# Patient Record
Sex: Female | Born: 1960 | Race: White | Hispanic: No | Marital: Single | Smoking: Never smoker
Health system: Southern US, Community
[De-identification: ages and names within clinical notes are randomized; demographics above are authoritative.]

## PROBLEM LIST (undated history)

## (undated) DIAGNOSIS — J45909 Unspecified asthma, uncomplicated: Secondary | ICD-10-CM

## (undated) HISTORY — PX: ROTATOR CUFF REPAIR: SHX139

## (undated) HISTORY — PX: CARPAL TUNNEL RELEASE: SHX101

## (undated) HISTORY — PX: MANDIBLE RECONSTRUCTION: SHX431

## (undated) HISTORY — PX: KNEE ARTHROSCOPY: SUR90

## (undated) HISTORY — PX: FOOT ARTHROPLASTY: SHX1657

---

## 2004-04-17 ENCOUNTER — Ambulatory Visit: Payer: Self-pay | Admitting: Internal Medicine

## 2004-04-20 ENCOUNTER — Ambulatory Visit: Payer: Self-pay | Admitting: Internal Medicine

## 2004-12-26 ENCOUNTER — Ambulatory Visit: Payer: Self-pay | Admitting: Internal Medicine

## 2005-04-24 ENCOUNTER — Ambulatory Visit: Payer: Self-pay | Admitting: Internal Medicine

## 2006-01-23 ENCOUNTER — Ambulatory Visit: Payer: Self-pay | Admitting: Internal Medicine

## 2007-03-20 ENCOUNTER — Ambulatory Visit: Payer: Self-pay | Admitting: Internal Medicine

## 2007-03-23 ENCOUNTER — Ambulatory Visit: Payer: Self-pay

## 2007-04-24 ENCOUNTER — Ambulatory Visit: Payer: Self-pay | Admitting: General Practice

## 2007-05-08 ENCOUNTER — Ambulatory Visit: Payer: Self-pay | Admitting: General Practice

## 2008-09-16 ENCOUNTER — Ambulatory Visit: Payer: Self-pay | Admitting: Internal Medicine

## 2008-09-26 ENCOUNTER — Ambulatory Visit: Payer: Self-pay | Admitting: Obstetrics and Gynecology

## 2008-09-29 ENCOUNTER — Ambulatory Visit: Payer: Self-pay | Admitting: Obstetrics and Gynecology

## 2008-10-06 ENCOUNTER — Ambulatory Visit: Payer: Self-pay | Admitting: Obstetrics and Gynecology

## 2008-12-02 ENCOUNTER — Ambulatory Visit: Payer: Self-pay | Admitting: Obstetrics and Gynecology

## 2008-12-08 ENCOUNTER — Ambulatory Visit: Payer: Self-pay | Admitting: Obstetrics and Gynecology

## 2009-04-22 ENCOUNTER — Ambulatory Visit: Payer: Self-pay | Admitting: Internal Medicine

## 2009-11-23 ENCOUNTER — Ambulatory Visit: Payer: Self-pay | Admitting: Oral Surgery

## 2011-08-30 ENCOUNTER — Ambulatory Visit: Payer: Self-pay | Admitting: Family Medicine

## 2016-10-24 ENCOUNTER — Other Ambulatory Visit: Payer: Self-pay | Admitting: Family Medicine

## 2016-10-24 DIAGNOSIS — Z1231 Encounter for screening mammogram for malignant neoplasm of breast: Secondary | ICD-10-CM

## 2016-11-12 ENCOUNTER — Ambulatory Visit
Admission: RE | Admit: 2016-11-12 | Discharge: 2016-11-12 | Disposition: A | Discharge status: Home / Self Care | Payer: BLUE CROSS/BLUE SHIELD | Source: Ambulatory Visit | Attending: Family Medicine | Admitting: Family Medicine

## 2016-11-12 ENCOUNTER — Other Ambulatory Visit: Payer: Self-pay | Admitting: Family Medicine

## 2016-11-12 DIAGNOSIS — Z1231 Encounter for screening mammogram for malignant neoplasm of breast: Secondary | ICD-10-CM

## 2019-02-18 NOTE — H&P (Addendum)
PRE-OP VISIT, ENDOMETRIAL BIOPSY:   Kerry Wilson is a 58 y.o. female presenting with Pre Op Consulting (sign consents, endo bx)  HPI: Patient presents today for a pre-op visit for a Total Laparoscopic Hysterectomy with Bilateral Salpingo Oophorectomy. Surgery is indicated by her longstanding history of pelvic pain, uterine fibroids, and menorrhagia. Pt also had a few episodes of postmenopausal bleeding this year. She is s/p Novasure ablation and s/p BTL.  Body mass index is 38.21 kg/m.  Workup: [place] & E2 in 12/2018: postmenopausal Pap: EMBx: collected today TVUS 01/2019:  Uterus=8.21 x 4.24 X 5.32cm Uterus anteverted Fibroids seen:1)anterior=1cm 2)posterior=3.2cm 3)Rt lateral=1.2cm Endometrium=6.83mm Cystic area seen Rt & anterior to endometrium=0.60 x 0.37 x 0.69cm No free fluid seen Lt simple ovarian cyst=1.1cm Rt ovary appears wnl  07/2015 saline sonogram: 3 fibroids, 2 simples right ovarian cysts; EMBx neg -Tx with Nexplanon placed 2015-2017 due to bleeding not resolved, failed OCPs -Novasure ablation in 10/2015  Pertinent Surgical Hx: -BTL -Endometrial ablation  Past Medical History:  has a past medical history of Hypothyroidism, Intermittent asthma, Iron deficiency anemia, and Obesity.  Past Surgical History:  has a past surgical history that includes Shoulder surgery ; Jaw surgery ; foot surgery (Left); wrist surgery (Left); uterine ablation (01/2016); Knee arthroscopy (Right); and Endometrial ablation. Family History: family history includes Diabetes type II in her mother; High blood pressure (Hypertension) in her mother; Myocardial Infarction (Heart attack) in her mother; Osteoporosis (Thinning of bones) in her mother; Stroke in her mother. Social History:  reports that she has never smoked. She has never used smokeless tobacco. She reports current alcohol use. She reports that she does not use drugs. OB/GYN History:  OB History    Gravida  0   Para      Term       Preterm      AB  0   Living        SAB  0   TAB      Ectopic      Molar      Multiple      Live Births           Allergies: is allergic to erythromycin. Medications:  Current Outpatient Medications:  .  ferrous gluconate (FERGON) 325 MG tablet, Take 325 mg by mouth daily with breakfast., Disp: , Rfl:  .  guaifen/phenyleph/acetaminophn (MUCINEX COLD AND SINUS ORAL), Take by mouth., Disp: , Rfl:   Review of Systems: No SOB, no palpitations or chest pain, no new lower extremity edema, no nausea or vomiting or bowel or bladder complaints. See HPI for gyn specific ROS.   Exam:   BP (!) 132/90   Pulse 89   Ht 165.1 cm (5\' 5" )   Wt (!) 104.1 kg (229 lb 9.6 oz)   BMI 38.21 kg/m   General: Patient is well-groomed, well-nourished, appears stated age in no acute distress   HEENT: head is atraumatic and normocephalic, trachea is midline, neck is supple with no palpable nodules   CV: Regular rhythm and normal heart rate, no murmur   Pulm: Clear to auscultation throughout lung fields with no wheezing, crackles, or rhonchi. No increased work of breathing  Abdomen: soft , no mass, non-tender, no rebound tenderness, no hepatomegaly  Long peterson's needed  Pelvic: tanner stage 5 ,   External genitalia: vulva /labia no lesions  Urethra: no prolapse  Vagina: normal physiologic d/c, laxity in vaginal walls  Cervix: no lesions, no cervical motion tenderness, good descent  Uterus: normal  size shape and contour, non-tender  Adnexa: no mass,  non-tender    Rectovaginal: External wnl  Endometrial biopsy: The cervix was cleaned with betadine, topical Hurriciane spray applied, and a single tooth tenaculum is applied to the anterior cervix. The Pipelle catheter was placed into the endometrial cavity. It sounds to 4 cm and scant tissue was removed.   I think it likely that he bx sample was insufficient. We talked about the risks of undiagnosed malignancy prior to  [facility]  Impression:   The primary encounter diagnosis was Excessive or frequent menstruation. Diagnoses of Pelvic pain in female, History of uterine fibroid, Post-menopausal bleeding, and Cervical os stenosis were also pertinent to this visit.  Plan:   1. Pre-Operative Visit  Patient returns for a preoperative discussion regarding her plans to proceed with surgical treatment of her chronic pelvic pain, uterine fibroids with menorrhagia by total laparoscopic hysterectomy with bilateral salpingo oophorectomy procedure.  Because of her scant bx, we will also remove ovaries.  We may perform a cystoscopy to evaluate the urinary tract after the procedure.   If significant pain, will keep overnight.  The patient and I discussed the technical aspects of the procedure including the potential for risks and complications.  These include but are not limited to the risk of infection requiring post-operative antibiotics or further procedures.  We talked about the risk of injury to adjacent organs including bladder, bowel, ureter, blood vessels or nerves.  We talked about the need to convert to an open incision.  We talked about the possible need for blood transfusion.  We talked about postop complications such as thromboembolic or cardiopulmonary complications.  All of her questions were answered.  Her preoperative exam was completed and the appropriate consents were signed. She is scheduled to undergo this procedure in the near future.  Specific Peri-operative Considerations:  - Consent: obtained today - Health Maintenance: none - Labs: CBC, CMP preoperatively - Studies: EKG, CXR preoperatively - Bowel Preparation: None required - Abx:  Ancef 2g - VTE ppx: SCDs perioperatively - Glucose Protocol: n/a - Beta-blockade: n/a   Diagnoses and all orders for this visit:  Excessive or frequent menstruation -     Pathology Report - [facility]  Pelvic pain in female -     Pathology Report - [facility]  History  of uterine fibroid -     Pathology Report - [facility]  Post-menopausal bleeding -     Pathology Report - [facility]  Cervical os stenosis

## 2019-02-23 ENCOUNTER — Encounter
Admission: RE | Admit: 2019-02-23 | Discharge: 2019-02-23 | Disposition: A | Discharge status: Home / Self Care | Payer: BC Managed Care – PPO | Source: Ambulatory Visit | Attending: Obstetrics and Gynecology | Admitting: Obstetrics and Gynecology

## 2019-02-23 ENCOUNTER — Other Ambulatory Visit: Payer: Self-pay

## 2019-02-23 DIAGNOSIS — Z01812 Encounter for preprocedural laboratory examination: Secondary | ICD-10-CM | POA: Insufficient documentation

## 2019-02-23 HISTORY — DX: Unspecified asthma, uncomplicated: J45.909

## 2019-02-23 LAB — CBC
HCT: 38.9 % (ref 36.0–46.0)
Hemoglobin: 13.1 g/dL (ref 12.0–15.0)
MCH: 30 pg (ref 26.0–34.0)
MCHC: 33.7 g/dL (ref 30.0–36.0)
MCV: 89.2 fL (ref 80.0–100.0)
Platelets: 196 10*3/uL (ref 150–400)
RBC: 4.36 MIL/uL (ref 3.87–5.11)
RDW: 12.5 % (ref 11.5–15.5)
WBC: 5.7 10*3/uL (ref 4.0–10.5)
nRBC: 0 % (ref 0.0–0.2)

## 2019-02-23 LAB — TYPE AND SCREEN
ABO/RH(D): O POS
Antibody Screen: NEGATIVE

## 2019-02-23 LAB — BASIC METABOLIC PANEL
Anion gap: 9 (ref 5–15)
BUN: 16 mg/dL (ref 6–20)
CO2: 22 mmol/L (ref 22–32)
Calcium: 9.3 mg/dL (ref 8.9–10.3)
Chloride: 107 mmol/L (ref 98–111)
Creatinine, Ser: 0.88 mg/dL (ref 0.44–1.00)
GFR calc Af Amer: >60 mL/min (ref >60)
GFR calc non Af Amer: >60 mL/min (ref >60)
Glucose, Bld: 128 mg/dL — ABNORMAL HIGH (ref 70–99)
Potassium: 3.5 mmol/L (ref 3.5–5.1)
Sodium: 138 mmol/L (ref 135–145)

## 2019-02-23 NOTE — Patient Instructions (Signed)
Your procedure is scheduled on: Monday 03/01/19.  Report to DAY SURGERY DEPARTMENT LOCATED ON 2ND FLOOR MEDICAL MALL ENTRANCE. To find out your arrival time please call [phone] between 1PM - 3PM on Friday 02/26/19.   Remember: Instructions that are not followed completely may result in serious medical risk, up to and including death, or upon the discretion of your surgeon and anesthesiologist your surgery may need to be rescheduled.      _X__ 1. Do not eat food after midnight the night before your procedure.                 No gum chewing or hard candies. You may drink clear liquids up to 2 hours                 before you are scheduled to arrive for your surgery- DO NOT drink clear                 liquids within 2 hours of the start of your surgery.                 Clear Liquids include:  water, apple juice without pulp, clear carbohydrate                 drink such as Clearfast or Gatorade, Black Coffee or Tea (Do not add                 milk or creamer to coffee or tea).    __X__2.  On the morning of surgery brush your teeth with toothpaste and water, you may rinse your mouth with mouthwash if you wish.  Do not swallow any toothpaste or mouthwash.     __X__3.  Notify your doctor if there is any change in your medical condition      (cold, fever, infections).       Do not wear jewelry, make-up, hairpins, clips or nail polish. Do not wear lotions, powders, or perfumes.  Do not shave 48 hours prior to surgery. Men may shave face and neck. Do not bring valuables to the hospital.      [facility] is not responsible for any belongings or valuables.    Contacts, dentures/partials or body piercings may not be worn into surgery. Bring a case for your contacts, glasses or hearing aids, a denture cup will be supplied. Leave your suitcase in the car. After surgery it may be brought to your room.     Patients discharged the day of surgery will not be allowed to drive  home.     Please read over the following fact sheets that you were given:   MRSA Information    __X__ Take these medicines the morning of surgery with A SIP OF WATER:     1. albuterol (VENTOLIN HFA) 108 (90 Base) MCG/ACT inhaler     __X__ Use CHG Soap as directed    _ X___ Use inhalers on the day of surgery. Also bring the inhaler with you to the hospital on the morning of surgery.    __X__ Stop Anti-inflammatories 7 days before surgery such as Advil, Ibuprofen, Motrin, BC or Goodies Powder, Naprosyn, Naproxen, Aleve, Aspirin, Meloxicam. May take Tylenol if needed for pain or discomfort.     __X__ Don't start taking any new herbals supplements before your procedure.

## 2019-02-25 ENCOUNTER — Other Ambulatory Visit
Admission: RE | Admit: 2019-02-25 | Discharge: 2019-02-25 | Disposition: A | Discharge status: Home / Self Care | Payer: BC Managed Care – PPO | Source: Ambulatory Visit | Attending: Obstetrics and Gynecology | Admitting: Obstetrics and Gynecology

## 2019-02-25 DIAGNOSIS — Z20828 Contact with and (suspected) exposure to other viral communicable diseases: Secondary | ICD-10-CM | POA: Diagnosis not present

## 2019-02-25 DIAGNOSIS — Z01812 Encounter for preprocedural laboratory examination: Secondary | ICD-10-CM | POA: Diagnosis not present

## 2019-02-25 LAB — SARS CORONAVIRUS 2 (TAT 6-24 HRS): SARS Coronavirus 2: NEGATIVE

## 2019-02-28 ENCOUNTER — Encounter: Payer: Self-pay | Admitting: Anesthesiology

## 2019-03-01 ENCOUNTER — Observation Stay
Admission: RE | Admit: 2019-03-01 | Discharge: 2019-03-02 | Disposition: A | Discharge status: Home / Self Care | Payer: BC Managed Care – PPO | Attending: Obstetrics and Gynecology | Admitting: Obstetrics and Gynecology

## 2019-03-01 ENCOUNTER — Ambulatory Visit: Payer: BC Managed Care – PPO | Admitting: Anesthesiology

## 2019-03-01 ENCOUNTER — Other Ambulatory Visit: Payer: Self-pay

## 2019-03-01 ENCOUNTER — Encounter: Payer: Self-pay | Admitting: *Deleted

## 2019-03-01 ENCOUNTER — Encounter
Admission: RE | Disposition: A | Discharge status: Home / Self Care | Payer: Self-pay | Source: Home / Self Care | Attending: Obstetrics and Gynecology

## 2019-03-01 DIAGNOSIS — G8929 Other chronic pain: Secondary | ICD-10-CM | POA: Insufficient documentation

## 2019-03-01 DIAGNOSIS — N803 Endometriosis of pelvic peritoneum: Secondary | ICD-10-CM | POA: Diagnosis not present

## 2019-03-01 DIAGNOSIS — D251 Intramural leiomyoma of uterus: Secondary | ICD-10-CM | POA: Insufficient documentation

## 2019-03-01 DIAGNOSIS — N800 Endometriosis of uterus: Secondary | ICD-10-CM | POA: Insufficient documentation

## 2019-03-01 DIAGNOSIS — R102 Pelvic and perineal pain unspecified side: Secondary | ICD-10-CM | POA: Diagnosis present

## 2019-03-01 DIAGNOSIS — N920 Excessive and frequent menstruation with regular cycle: Secondary | ICD-10-CM | POA: Diagnosis not present

## 2019-03-01 DIAGNOSIS — D509 Iron deficiency anemia, unspecified: Secondary | ICD-10-CM | POA: Diagnosis not present

## 2019-03-01 DIAGNOSIS — K660 Peritoneal adhesions (postprocedural) (postinfection): Secondary | ICD-10-CM | POA: Diagnosis not present

## 2019-03-01 DIAGNOSIS — N882 Stricture and stenosis of cervix uteri: Secondary | ICD-10-CM | POA: Diagnosis not present

## 2019-03-01 DIAGNOSIS — N838 Other noninflammatory disorders of ovary, fallopian tube and broad ligament: Secondary | ICD-10-CM | POA: Diagnosis not present

## 2019-03-01 HISTORY — PX: LAPAROSCOPIC HYSTERECTOMY: SHX1926

## 2019-03-01 HISTORY — PX: CYSTOSCOPY: SHX5120

## 2019-03-01 LAB — ABO/RH: ABO/RH(D): O POS

## 2019-03-01 SURGERY — HYSTERECTOMY, TOTAL, LAPAROSCOPIC
Anesthesia: General

## 2019-03-01 MED ORDER — LIDOCAINE HCL (PF) 2 % IJ SOLN
INTRAMUSCULAR | Status: AC
  Filled 2019-03-01: qty 10

## 2019-03-01 MED ORDER — SUGAMMADEX SODIUM 200 MG/2ML IV SOLN
INTRAVENOUS | Status: DC | PRN
  Administered 2019-03-01: 208 mg via INTRAVENOUS

## 2019-03-01 MED ORDER — MIDAZOLAM HCL 2 MG/2ML IJ SOLN
INTRAMUSCULAR | Status: DC | PRN
  Administered 2019-03-01: 2 mg via INTRAVENOUS

## 2019-03-01 MED ORDER — PROPOFOL 10 MG/ML IV BOLUS
INTRAVENOUS | Status: AC
  Filled 2019-03-01: qty 20

## 2019-03-01 MED ORDER — SUCCINYLCHOLINE CHLORIDE 20 MG/ML IJ SOLN
INTRAMUSCULAR | Status: DC | PRN
  Administered 2019-03-01: 100 mg via INTRAVENOUS

## 2019-03-01 MED ORDER — HYDROMORPHONE HCL 1 MG/ML IJ SOLN
0.5000 mg | INTRAMUSCULAR | Status: AC | PRN
  Administered 2019-03-01 (×2): 0.5 mg via INTRAVENOUS

## 2019-03-01 MED ORDER — OXYCODONE HCL 5 MG PO CAPS: 5.0000 mg | ORAL_CAPSULE | Freq: Four times a day (QID) | ORAL | 0 refills | Status: AC | PRN

## 2019-03-01 MED ORDER — FENTANYL CITRATE (PF) 100 MCG/2ML IJ SOLN
INTRAMUSCULAR | Status: AC
  Administered 2019-03-01: 25 ug via INTRAVENOUS
  Filled 2019-03-01: qty 2

## 2019-03-01 MED ORDER — ACETAMINOPHEN 500 MG PO TABS
1000.0000 mg | ORAL_TABLET | ORAL | Status: AC
  Administered 2019-03-01: 1000 mg via ORAL

## 2019-03-01 MED ORDER — GABAPENTIN 300 MG PO CAPS
300.0000 mg | ORAL_CAPSULE | ORAL | Status: AC
  Administered 2019-03-01: 06:00:00 300 mg via ORAL

## 2019-03-01 MED ORDER — HYDROMORPHONE HCL 1 MG/ML IJ SOLN: 0.2000 mg | INTRAMUSCULAR | Status: DC | PRN

## 2019-03-01 MED ORDER — HYDROMORPHONE HCL 1 MG/ML IJ SOLN
0.5000 mg | INTRAMUSCULAR | Status: DC | PRN
  Administered 2019-03-01: 0.5 mg via INTRAVENOUS

## 2019-03-01 MED ORDER — LACTATED RINGERS IV SOLN
INTRAVENOUS | Status: DC
  Administered 2019-03-01 (×2): via INTRAVENOUS

## 2019-03-01 MED ORDER — FAMOTIDINE 20 MG PO TABS
ORAL_TABLET | ORAL | Status: AC
  Filled 2019-03-01: qty 1

## 2019-03-01 MED ORDER — KETOROLAC TROMETHAMINE 30 MG/ML IJ SOLN: 30.0000 mg | Freq: Once | INTRAMUSCULAR | Status: DC

## 2019-03-01 MED ORDER — ACETAMINOPHEN 500 MG PO TABS: 1000.0000 mg | ORAL_TABLET | Freq: Four times a day (QID) | ORAL | 0 refills | Status: AC

## 2019-03-01 MED ORDER — ACETAMINOPHEN 500 MG PO TABS
ORAL_TABLET | ORAL | Status: AC
  Filled 2019-03-01: qty 2

## 2019-03-01 MED ORDER — CEFAZOLIN SODIUM-DEXTROSE 2-4 GM/100ML-% IV SOLN
INTRAVENOUS | Status: AC
  Filled 2019-03-01: qty 100

## 2019-03-01 MED ORDER — ALBUTEROL SULFATE (2.5 MG/3ML) 0.083% IN NEBU: 3.0000 mL | INHALATION_SOLUTION | Freq: Four times a day (QID) | RESPIRATORY_TRACT | Status: DC | PRN

## 2019-03-01 MED ORDER — GABAPENTIN 300 MG PO CAPS
ORAL_CAPSULE | ORAL | Status: AC
  Filled 2019-03-01: qty 1

## 2019-03-01 MED ORDER — GABAPENTIN 300 MG PO CAPS
900.0000 mg | ORAL_CAPSULE | Freq: Every day | ORAL | Status: DC
  Administered 2019-03-01: 20:00:00 900 mg via ORAL
  Filled 2019-03-01: qty 3

## 2019-03-01 MED ORDER — SUGAMMADEX SODIUM 200 MG/2ML IV SOLN
INTRAVENOUS | Status: AC
  Filled 2019-03-01: qty 2

## 2019-03-01 MED ORDER — ALUM & MAG HYDROXIDE-SIMETH 200-200-20 MG/5ML PO SUSP: 30.0000 mL | ORAL | Status: DC | PRN

## 2019-03-01 MED ORDER — HYDROMORPHONE HCL 1 MG/ML IJ SOLN
0.5000 mg | INTRAMUSCULAR | Status: AC | PRN
  Administered 2019-03-01 (×4): 0.5 mg via INTRAVENOUS

## 2019-03-01 MED ORDER — OXYCODONE HCL 5 MG PO TABS
5.0000 mg | ORAL_TABLET | ORAL | Status: DC | PRN
  Administered 2019-03-02 (×2): 5 mg via ORAL
  Filled 2019-03-01 (×2): qty 1

## 2019-03-01 MED ORDER — ONDANSETRON HCL 4 MG/2ML IJ SOLN: 4.0000 mg | Freq: Once | INTRAMUSCULAR | Status: DC | PRN

## 2019-03-01 MED ORDER — HYDROMORPHONE HCL 1 MG/ML IJ SOLN
INTRAMUSCULAR | Status: AC
  Administered 2019-03-01: 11:00:00 0.5 mg via INTRAVENOUS
  Filled 2019-03-01: qty 1

## 2019-03-01 MED ORDER — FENTANYL CITRATE (PF) 100 MCG/2ML IJ SOLN
25.0000 ug | INTRAMUSCULAR | Status: DC | PRN
  Administered 2019-03-01 (×4): 25 ug via INTRAVENOUS

## 2019-03-01 MED ORDER — DEXAMETHASONE SODIUM PHOSPHATE 10 MG/ML IJ SOLN
INTRAMUSCULAR | Status: AC
  Filled 2019-03-01: qty 1

## 2019-03-01 MED ORDER — FAMOTIDINE 20 MG PO TABS
20.0000 mg | ORAL_TABLET | Freq: Once | ORAL | Status: AC
  Administered 2019-03-01: 20 mg via ORAL

## 2019-03-01 MED ORDER — EPHEDRINE SULFATE 50 MG/ML IJ SOLN
INTRAMUSCULAR | Status: AC
  Filled 2019-03-01: qty 1

## 2019-03-01 MED ORDER — IBUPROFEN 800 MG PO TABS: 800.0000 mg | ORAL_TABLET | Freq: Three times a day (TID) | ORAL | 1 refills | Status: AC

## 2019-03-01 MED ORDER — ROCURONIUM BROMIDE 100 MG/10ML IV SOLN
INTRAVENOUS | Status: DC | PRN
  Administered 2019-03-01: 50 mg via INTRAVENOUS
  Administered 2019-03-01: 20 mg via INTRAVENOUS
  Administered 2019-03-01: 10 mg via INTRAVENOUS

## 2019-03-01 MED ORDER — ROCURONIUM BROMIDE 50 MG/5ML IV SOLN
INTRAVENOUS | Status: AC
  Filled 2019-03-01: qty 1

## 2019-03-01 MED ORDER — ACETAMINOPHEN 500 MG PO TABS
1000.0000 mg | ORAL_TABLET | Freq: Four times a day (QID) | ORAL | Status: DC
  Administered 2019-03-01 – 2019-03-02 (×5): 1000 mg via ORAL
  Filled 2019-03-01 (×5): qty 2

## 2019-03-01 MED ORDER — FUROSEMIDE 10 MG/ML IJ SOLN
INTRAMUSCULAR | Status: AC
  Filled 2019-03-01: qty 4

## 2019-03-01 MED ORDER — ONDANSETRON HCL 4 MG PO TABS: 4.0000 mg | ORAL_TABLET | Freq: Four times a day (QID) | ORAL | Status: DC | PRN

## 2019-03-01 MED ORDER — FENTANYL CITRATE (PF) 100 MCG/2ML IJ SOLN
INTRAMUSCULAR | Status: AC
  Filled 2019-03-01: qty 2

## 2019-03-01 MED ORDER — HYDROMORPHONE HCL 1 MG/ML IJ SOLN
INTRAMUSCULAR | Status: AC
  Administered 2019-03-01: 0.5 mg via INTRAVENOUS
  Filled 2019-03-01: qty 1

## 2019-03-01 MED ORDER — MENTHOL 3 MG MT LOZG
1.0000 | LOZENGE | OROMUCOSAL | Status: DC | PRN
  Filled 2019-03-01: qty 9

## 2019-03-01 MED ORDER — SUCCINYLCHOLINE CHLORIDE 20 MG/ML IJ SOLN
INTRAMUSCULAR | Status: AC
  Filled 2019-03-01: qty 1

## 2019-03-01 MED ORDER — BUPIVACAINE HCL (PF) 0.5 % IJ SOLN
INTRAMUSCULAR | Status: AC
  Filled 2019-03-01: qty 30

## 2019-03-01 MED ORDER — HYDROMORPHONE HCL 1 MG/ML IJ SOLN
INTRAMUSCULAR | Status: AC
  Filled 2019-03-01: qty 1

## 2019-03-01 MED ORDER — BUPIVACAINE HCL 0.5 % IJ SOLN
INTRAMUSCULAR | Status: DC | PRN
  Administered 2019-03-01: 13 mL

## 2019-03-01 MED ORDER — DOCUSATE SODIUM 100 MG PO CAPS
100.0000 mg | ORAL_CAPSULE | Freq: Two times a day (BID) | ORAL | Status: DC
  Administered 2019-03-01 – 2019-03-02 (×2): 100 mg via ORAL
  Filled 2019-03-01 (×2): qty 1

## 2019-03-01 MED ORDER — ONDANSETRON HCL 4 MG/2ML IJ SOLN
INTRAMUSCULAR | Status: AC
  Filled 2019-03-01: qty 2

## 2019-03-01 MED ORDER — FLUORESCEIN SODIUM 10 % IV SOLN
INTRAVENOUS | Status: AC
  Filled 2019-03-01: qty 5

## 2019-03-01 MED ORDER — CEFAZOLIN SODIUM-DEXTROSE 2-4 GM/100ML-% IV SOLN
2.0000 g | INTRAVENOUS | Status: AC
  Administered 2019-03-01: 2 g via INTRAVENOUS

## 2019-03-01 MED ORDER — SIMETHICONE 80 MG PO CHEW
80.0000 mg | CHEWABLE_TABLET | Freq: Four times a day (QID) | ORAL | Status: DC | PRN
  Administered 2019-03-01 (×2): 80 mg via ORAL
  Filled 2019-03-01 (×2): qty 1

## 2019-03-01 MED ORDER — PROPOFOL 10 MG/ML IV BOLUS
INTRAVENOUS | Status: DC | PRN
  Administered 2019-03-01: 150 mg via INTRAVENOUS

## 2019-03-01 MED ORDER — DOCUSATE SODIUM 100 MG PO CAPS: 100.0000 mg | ORAL_CAPSULE | Freq: Two times a day (BID) | ORAL | 0 refills | Status: AC

## 2019-03-01 MED ORDER — FENTANYL CITRATE (PF) 100 MCG/2ML IJ SOLN
INTRAMUSCULAR | Status: DC | PRN
  Administered 2019-03-01 (×2): 25 ug via INTRAVENOUS
  Administered 2019-03-01: 50 ug via INTRAVENOUS

## 2019-03-01 MED ORDER — GABAPENTIN 800 MG PO TABS: 800.0000 mg | ORAL_TABLET | Freq: Every day | ORAL | 0 refills | Status: AC

## 2019-03-01 MED ORDER — ONDANSETRON HCL 4 MG/2ML IJ SOLN
INTRAMUSCULAR | Status: DC | PRN
  Administered 2019-03-01: 4 mg via INTRAVENOUS

## 2019-03-01 MED ORDER — MIDAZOLAM HCL 2 MG/2ML IJ SOLN
INTRAMUSCULAR | Status: AC
  Filled 2019-03-01: qty 2

## 2019-03-01 MED ORDER — LIDOCAINE HCL (CARDIAC) PF 100 MG/5ML IV SOSY
PREFILLED_SYRINGE | INTRAVENOUS | Status: DC | PRN
  Administered 2019-03-01: 100 mg via INTRAVENOUS

## 2019-03-01 MED ORDER — LACTATED RINGERS IV SOLN
INTRAVENOUS | Status: DC
  Administered 2019-03-01: 13:00:00 via INTRAVENOUS

## 2019-03-01 MED ORDER — DEXAMETHASONE SODIUM PHOSPHATE 10 MG/ML IJ SOLN
INTRAMUSCULAR | Status: DC | PRN
  Administered 2019-03-01: 5 mg via INTRAVENOUS

## 2019-03-01 MED ORDER — ONDANSETRON HCL 4 MG/2ML IJ SOLN: 4.0000 mg | Freq: Four times a day (QID) | INTRAMUSCULAR | Status: DC | PRN

## 2019-03-01 MED ORDER — EPHEDRINE SULFATE 50 MG/ML IJ SOLN
INTRAMUSCULAR | Status: DC | PRN
  Administered 2019-03-01 (×2): 5 mg via INTRAVENOUS

## 2019-03-01 MED ORDER — IBUPROFEN 800 MG PO TABS
800.0000 mg | ORAL_TABLET | Freq: Three times a day (TID) | ORAL | Status: DC
  Administered 2019-03-01 – 2019-03-02 (×4): 800 mg via ORAL
  Filled 2019-03-01 (×4): qty 1

## 2019-03-01 SURGICAL SUPPLY — 72 items
APPLICATOR ARISTA FLEXITIP XL (MISCELLANEOUS) ×2 IMPLANT
BAG URINE DRAIN 2000ML AR STRL (UROLOGICAL SUPPLIES) ×10 IMPLANT
BLADE SURG SZ11 CARB STEEL (BLADE) ×5 IMPLANT
CATH FOLEY 2WAY  5CC 16FR (CATHETERS) ×2
CATH URTH 16FR FL 2W BLN LF (CATHETERS) ×3 IMPLANT
CHLORAPREP W/TINT 26 (MISCELLANEOUS) ×5 IMPLANT
CLOSURE WOUND 1/4X4 (GAUZE/BANDAGES/DRESSINGS) ×1
CORD MONOPOLAR M/FML 12FT (MISCELLANEOUS) ×5 IMPLANT
COUNTER NEEDLE 20/40 LG (NEEDLE) ×5 IMPLANT
COVER LIGHT HANDLE STERIS (MISCELLANEOUS) ×10 IMPLANT
COVER WAND RF STERILE (DRAPES) ×5 IMPLANT
DERMABOND ADVANCED (GAUZE/BANDAGES/DRESSINGS) ×2
DERMABOND ADVANCED .7 DNX12 (GAUZE/BANDAGES/DRESSINGS) ×3 IMPLANT
DEVICE SUTURE ENDOST 10MM (ENDOMECHANICALS) ×2 IMPLANT
DRAPE GENERAL ENDO 106X123.5 (DRAPES) ×5 IMPLANT
DRAPE LEGGINS SURG 28X43 STRL (DRAPES) ×5 IMPLANT
DRAPE STERI POUCH LG 24X46 STR (DRAPES) ×5 IMPLANT
DRAPE UNDER BUTTOCK W/FLU (DRAPES) ×5 IMPLANT
DRSG TEGADERM 2-3/8X2-3/4 SM (GAUZE/BANDAGES/DRESSINGS) ×15 IMPLANT
GAUZE 4X4 16PLY RFD (DISPOSABLE) ×2 IMPLANT
GLOVE BIO SURGEON STRL SZ7 (GLOVE) ×15 IMPLANT
GLOVE INDICATOR 7.5 STRL GRN (GLOVE) ×5 IMPLANT
GOWN STRL REUS W/ TWL LRG LVL3 (GOWN DISPOSABLE) ×6 IMPLANT
GOWN STRL REUS W/ TWL XL LVL3 (GOWN DISPOSABLE) ×3 IMPLANT
GOWN STRL REUS W/TWL LRG LVL3 (GOWN DISPOSABLE) ×4
GOWN STRL REUS W/TWL XL LVL3 (GOWN DISPOSABLE) ×2
GRASPER SUT TROCAR 14GX15 (MISCELLANEOUS) ×5 IMPLANT
HEMOSTAT ARISTA ABSORB 3G PWDR (HEMOSTASIS) ×2 IMPLANT
IRRIGATION STRYKERFLOW (MISCELLANEOUS) ×3 IMPLANT
IRRIGATOR STRYKERFLOW (MISCELLANEOUS) ×5
IV NS 1000ML (IV SOLUTION) ×2
IV NS 1000ML BAXH (IV SOLUTION) ×3 IMPLANT
KIT PINK PAD W/HEAD ARE REST (MISCELLANEOUS) ×5
KIT PINK PAD W/HEAD ARM REST (MISCELLANEOUS) ×3 IMPLANT
KIT TURNOVER CYSTO (KITS) ×5 IMPLANT
LABEL OR SOLS (LABEL) ×5 IMPLANT
LIGASURE VESSEL 5MM BLUNT TIP (ELECTROSURGICAL) ×2 IMPLANT
MANIPULATOR VCARE LG CRV RETR (MISCELLANEOUS) IMPLANT
MANIPULATOR VCARE SML CRV RETR (MISCELLANEOUS) ×2 IMPLANT
MANIPULATOR VCARE STD CRV RETR (MISCELLANEOUS) IMPLANT
NDL FILTER BLUNT 18X1 1/2 (NEEDLE) ×3 IMPLANT
NEEDLE FILTER BLUNT 18X 1/2SAF (NEEDLE) ×2
NEEDLE FILTER BLUNT 18X1 1/2 (NEEDLE) ×3 IMPLANT
NS IRRIG 500ML POUR BTL (IV SOLUTION) ×5 IMPLANT
OCCLUDER COLPOPNEUMO (BALLOONS) ×5 IMPLANT
PACK GYN LAPAROSCOPIC (MISCELLANEOUS) ×5 IMPLANT
PAD OB MATERNITY 4.3X12.25 (PERSONAL CARE ITEMS) ×5 IMPLANT
PAD PREP 24X41 OB/GYN DISP (PERSONAL CARE ITEMS) ×5 IMPLANT
POUCH SPECIMEN RETRIEVAL 10MM (ENDOMECHANICALS) IMPLANT
SCISSORS METZENBAUM CVD 33 (INSTRUMENTS) ×2 IMPLANT
SET CYSTO W/LG BORE CLAMP LF (SET/KITS/TRAYS/PACK) IMPLANT
SET TUBE SMOKE EVAC HIGH FLOW (TUBING) ×5 IMPLANT
SLEEVE ENDOPATH XCEL 5M (ENDOMECHANICALS) ×5 IMPLANT
SPONGE GAUZE 2X2 8PLY STER LF (GAUZE/BANDAGES/DRESSINGS) ×2
SPONGE GAUZE 2X2 8PLY STRL LF (GAUZE/BANDAGES/DRESSINGS) ×8 IMPLANT
STRIP CLOSURE SKIN 1/4X4 (GAUZE/BANDAGES/DRESSINGS) ×4 IMPLANT
SUT ENDO VLOC 180-0-8IN (SUTURE) ×2 IMPLANT
SUT MNCRL 4-0 (SUTURE) ×2
SUT MNCRL 4-0 27XMFL (SUTURE) ×3
SUT MNCRL AB 4-0 PS2 18 (SUTURE) ×5 IMPLANT
SUT VIC AB 0 CT1 36 (SUTURE) ×10 IMPLANT
SUT VIC AB 2-0 UR6 27 (SUTURE) ×5 IMPLANT
SUT VIC AB 4-0 SH 27 (SUTURE)
SUT VIC AB 4-0 SH 27XANBCTRL (SUTURE) ×3 IMPLANT
SUTURE MNCRL 4-0 27XMF (SUTURE) ×3 IMPLANT
SYR 10ML LL (SYRINGE) ×5 IMPLANT
SYR 50ML LL SCALE MARK (SYRINGE) ×5 IMPLANT
SYR 5ML LL (SYRINGE) ×5 IMPLANT
TROCAR ENDO BLADELESS 11MM (ENDOMECHANICALS) ×2 IMPLANT
TROCAR XCEL NON-BLD 5MMX100MML (ENDOMECHANICALS) ×5 IMPLANT
TUBING ART PRESS 48 MALE/FEM (TUBING) IMPLANT
TUBING EVAC SMOKE HEATED PNEUM (TUBING) ×5 IMPLANT

## 2019-03-01 NOTE — Anesthesia Procedure Notes (Signed)
Procedure Name: Intubation Date/Time: 03/01/2019 7:36 AM Performed by: Caryl Asp, CRNA Pre-anesthesia Checklist: Patient identified, Patient being monitored, Timeout performed, Emergency Drugs available and Suction available Patient Re-evaluated:Patient Re-evaluated prior to induction Oxygen Delivery Method: Circle system utilized Preoxygenation: Pre-oxygenation with 100% oxygen Induction Type: IV induction Ventilation: Mask ventilation without difficulty and Oral airway inserted - appropriate to patient size Laryngoscope Size: 3 and McGraph Grade View: Grade I Tube type: Oral Tube size: 7.0 mm Number of attempts: 1 Airway Equipment and Method: Stylet Placement Confirmation: ETT inserted through vocal cords under direct vision,  positive ETCO2 and breath sounds checked- equal and bilateral Secured at: 21 cm Tube secured with: Tape Dental Injury: Teeth and Oropharynx as per pre-operative assessment

## 2019-03-01 NOTE — Interval H&P Note (Signed)
History and Physical Interval Note:  03/01/2019 7:24 AM  Kerry Wilson  has presented today for surgery, with the diagnosis of pelvic pain, abnormal bleeding.  The various methods of treatment have been discussed with the patient and family. After consideration of risks, benefits and other options for treatment, the patient has consented to  Procedure(s): HYSTERECTOMY TOTAL LAPAROSCOPIC WITH BILATERAL SALPINGO OOPHERECTOMY (N/A) LAPAROSCOPIC BILATERAL SALPINGECTOMY (Bilateral) as a surgical intervention.  The patient's history has been reviewed, patient examined, no change in status, stable for surgery.  I have reviewed the patient's chart and labs.  Questions were answered to the patient's satisfaction.     Benjaman Kindler

## 2019-03-01 NOTE — Anesthesia Postprocedure Evaluation (Signed)
Anesthesia Post Note  Patient: Kerry Wilson  Procedure(s) Performed: HYSTERECTOMY TOTAL LAPAROSCOPIC WITH BILATERAL SALPINGO OOPHERECTOMY (N/A ) CYSTOSCOPY  Patient location during evaluation: PACU Anesthesia Type: General Level of consciousness: awake and alert Pain management: pain level controlled Vital Signs Assessment: post-procedure vital signs reviewed and stable Respiratory status: spontaneous breathing, nonlabored ventilation, respiratory function stable and patient connected to nasal cannula oxygen Cardiovascular status: blood pressure returned to baseline and stable Postop Assessment: no apparent nausea or vomiting Anesthetic complications: no     Last Vitals:  Vitals:   03/01/19 1157 03/01/19 1205  BP:  121/68  Pulse: 83 86  Resp: 18 12  Temp:  36.7 C  SpO2: 94% 96%    Last Pain:  Vitals:   03/01/19 1205  TempSrc:   PainSc: [place]

## 2019-03-01 NOTE — Anesthesia Post-op Follow-up Note (Signed)
Anesthesia QCDR form completed.        

## 2019-03-01 NOTE — Transfer of Care (Signed)
Immediate Anesthesia Transfer of Care Note  Patient: Kerry Wilson  Procedure(s) Performed: HYSTERECTOMY TOTAL LAPAROSCOPIC WITH BILATERAL SALPINGO OOPHERECTOMY (N/A ) CYSTOSCOPY  Patient Location: PACU  Anesthesia Type:General  Level of Consciousness: awake and alert   Airway & Oxygen Therapy: Patient Spontanous Breathing and Patient connected to face mask oxygen  Post-op Assessment: Report given to RN and Post -op Vital signs reviewed and stable  Post vital signs: Reviewed and stable  Last Vitals:  Vitals Value Taken Time  BP 123/64 03/01/19 1011  Temp 36.3 C 03/01/19 1011  Pulse 75 03/01/19 1013  Resp 15 03/01/19 1013  SpO2 100 % 03/01/19 1013  Vitals shown include unvalidated device data.  Last Pain:  Vitals:   03/01/19 0617  TempSrc: Oral  PainSc: 6          Complications: No apparent anesthesia complications

## 2019-03-01 NOTE — Op Note (Addendum)
[facility] PROCEDURE DATE: 03/01/2019  PREOPERATIVE DIAGNOSIS: chronic pelvic pain, menorrhagia and fibroids POSTOPERATIVE DIAGNOSIS: The same PROCEDURE:  HYSTERECTOMY TOTAL LAPAROSCOPIC WITH BILATERAL SALPINGO OOPHERECTOMY:  CYSTOSCOPY:   SURGEON:  Dr. Benjaman Kindler ASSISTANT: Dr. Vikki Ports Ward  Anesthesiologist:  Anesthesiologist: Gunnar Bulla, MD CRNA: Jonna Clark, CRNA; Caryl Asp, CRNA  INDICATIONS: 58 y.o.  G0 here for definitive surgical management secondary to the indications listed under preoperative diagnoses; please see preoperative note for further details. Prior ablation and BTL.   Risks of surgery were discussed with the patient including but not limited to: bleeding which may require transfusion or reoperation; infection which may require antibiotics; injury to bowel, bladder, ureters or other surrounding organs; need for additional procedures; thromboembolic phenomenon, incisional problems and other postoperative/anesthesia complications. Written informed consent was obtained.    FINDINGS:  Small uterus, normal vagina and perineum. Normal bilateral ovaries. Normal appendix, normal upper abdomen. 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 Small adhesions between bowel and side wall. Normal bladder with bilateral efflux. Trabeculations noted.   ANESTHESIA:    General INTRAVENOUS FLUIDS:600  ml ESTIMATED BLOOD LOSS:50 ml URINE OUTPUT: 300 ml   SPECIMENS: Uterus, cervix, bilateral fallopian tubes and bilateral ovaries COMPLICATIONS: None immediate  PROCEDURE IN DETAIL:  The patient received prophalactic intravenous antibiotics and had sequential compression devices applied to her lower extremities while in the preoperative area.  She was then taken to the operating room where general anesthesia was administered and was found to be adequate.  She was placed in the dorsal lithotomy position, and was prepped and draped in a sterile manner.  A formal  time out was performed with all team members present and in agreement.  A V-care uterine manipulator was placed at this time.  A Foley catheter was inserted into her bladder and attached to constant drainage. Attention was turned to the abdomen where an umbilical incision was made with the scalpel.  The Optiview 5-mm trocar and sleeve were then advanced without difficulty with the laparoscope under direct visualization into the abdomen.  The abdomen was then insufflated with carbon dioxide gas and adequate pneumoperitoneum was obtained.  A survey of the patient's pelvis and abdomen revealed the findings above.  Bilateral lower quadrant ports (5 mm on the right and 5 mm on the left) were then placed under direct visualization.  The pelvis was then carefully examined.  Attention was turned to the IP ligaments. These were fulgurated and ligated, freeing the ovaries from the pelvic sidewall. The broad ligament was transected and the anterior and posterior leaflets of the broad opened. The uterine artery was then skeletonized and a bladder flap was created.  The ureters were noted to be safely away from the area of dissection.  The bladder was then bluntly dissected off the lower uterine segment.    At this point, attention was turned to the uterine vessels, which were clamped and ligated using the Ligasure.  Good hemostasis was noted overall.  The uterosacral and cardinal ligaments were clamped, cut and ligated bilaterally .  Attention was then turned to the cervicovaginal junction, and the bipolar scissors were used to transect the cervix from the surrounding vagina using the ring of the V-care as a guide. This was done circumferentially allowing total hysterectomy.  The uterus was then removed from the vagina and the vaginal cuff incision was then closed with running V-loc.  Overall excellent hemostasis was noted.    Attention was returned to the abdomen.The ureters were reexamined  bilaterally and were pulsating  normally. The abdominal pressure was reduced and hemostasis was confirmed.   Intravenous floruoceine was administered, and cystoscopy showed bilateral ureteral jets.  No stitches were visualized in the bladder during cystoscopy.  The 36mm port fascia was closed with a vertical mattress with 0-Vicryl, using the cone closure system. All trocars were removed under direct visualization, and the abdomen was desufflated.  The fascial incision of the umbilicus was closed with a 0 Vicryl figure of eight stitch.  All skin incisions were closed with 4-0 Vicryl subcuticular stitches and Dermabond. The patient tolerated the procedures well.  All instruments, needles, and sponge counts were correct x 2. The patient was taken to the recovery room awake, extubated and in stable condition.

## 2019-03-01 NOTE — Anesthesia Preprocedure Evaluation (Signed)
Anesthesia Evaluation  Patient identified by MRN, date of birth, ID band Patient awake    Reviewed: Allergy & Precautions, NPO status , Patient's Chart, lab work & pertinent test results, reviewed documented beta blocker date and time   Airway Mallampati: III  TM Distance: >3 FB     Dental  (+) Chipped   Pulmonary           Cardiovascular      Neuro/Psych    GI/Hepatic   Endo/Other    Renal/GU      Musculoskeletal   Abdominal   Peds  Hematology   Anesthesia Other Findings   Reproductive/Obstetrics                             Anesthesia Physical Anesthesia Plan  ASA: II  Anesthesia Plan: General   Post-op Pain Management:    Induction: Intravenous  PONV Risk Score and Plan:   Airway Management Planned: Oral ETT  Additional Equipment:   Intra-op Plan:   Post-operative Plan:   Informed Consent: I have reviewed the patients History and Physical, chart, labs and discussed the procedure including the risks, benefits and alternatives for the proposed anesthesia with the patient or authorized representative who has indicated his/her understanding and acceptance.       Plan Discussed with: CRNA  Anesthesia Plan Comments:         Anesthesia Quick Evaluation

## 2019-03-01 NOTE — Discharge Instructions (Signed)
Discharge instructions after   total laparoscopic hysterectomy   For the next three days, take ibuprofen and acetaminophen on a schedule, every 8 hours. You can take them together or you can intersperse them, and take one every four hours. I also gave you gabapentin for nighttime, to help you sleep and also to control pain. Take gabapentin medicines at night for at least the next 3 nights. You also have a narcotic, oxycodone, to take as needed if the above medicines don't help.  Postop constipation is a major cause of pain. Stay well hydrated, walk as you tolerate, and take over the counter senna as well as stool softeners if you need them.    Signs and Symptoms to Report Call our office at [phone] if you have any of the following.  . Fever over 100.4 degrees or higher . Severe stomach pain not relieved with pain medications . Bright red bleeding that's heavier than a period that does not slow with rest . To go the bathroom a lot (frequency), you can't hold your urine (urgency), or it hurts when you empty your bladder (urinate) . Chest pain . Shortness of breath . Pain in the calves of your legs . Severe nausea and vomiting not relieved with anti-nausea medications . Signs of infection around your wounds, such as redness, hot to touch, swelling, green/yellow drainage (like pus), bad smelling discharge . Any concerns  What You Can Expect after Surgery . You may see some pink tinged, bloody fluid and bruising around the wound. This is normal. . You may notice shoulder and neck pain. This is caused by the gas used during surgery to expand your abdomen so your surgeon could get to the uterus easier. . You may have a sore throat because of the tube in your mouth during general anesthesia. This will go away in 2 to 3 days. . You may have some stomach cramps. . You may notice spotting on your panties. . You may have pain around the incision sites.   Activities after Your  Discharge Follow these guidelines to help speed your recovery at home: . Do the coughing and deep breathing as you did in the hospital for 2 weeks. Use the small blue breathing device, called the incentive spirometer for 2 weeks. . Don't drive if you are in pain or taking narcotic pain medicine. You may drive when you can safely slam on the brakes, turn the wheel forcefully, and rotate your torso comfortably. This is typically 1-2 weeks. Practice in a parking lot or side street prior to attempting to drive regularly.  . Ask others to help with household chores for 4 weeks. . Do not lift anything heavier that 10 pounds for 4-6 weeks. This includes pets, children, and groceries. . Don't do strenuous activities, exercises, or sports like vacuuming, tennis, squash, etc. until your doctor says it is safe to do so. ---Maintain pelvic rest for 8 weeks. This means nothing in the vagina or rectum at all (no douching, tampons, intercourse) for 8 weeks.  . Walk as you feel able. Rest often since it may take two or three weeks for your energy level to return to normal.  . You may climb stairs . Avoid constipation:   -Eat fruits, vegetables, and whole grains. Eat small meals as your appetite will take time to return to normal.   -Drink 6 to 8 glasses of water each day unless your doctor has told you to limit your fluids.   -Use a laxative   or stool softener as needed if constipation becomes a problem. You may take Miralax, metamucil, Citrucil, Colace, Senekot, FiberCon, etc. If this does not relieve the constipation, try two tablespoons of Milk Of Magnesia every 8 hours until your bowels move.  . You may shower. Gently wash the wounds with a mild soap and water. Pat dry. . Do not get in a hot tub, swimming pool, etc. for 6 weeks. . Do not use lotions, oils, powders on the wounds. . Do not douche, use tampons, or have sex until your doctor says it is okay. . Take your pain medicine when you need it. The medicine  may not work as well if the pain is bad.  Take the medicines you were taking before surgery. Other medications you will need are pain medications and possibly constipation and nausea medications (Zofran).    AMBULATORY SURGERY  DISCHARGE INSTRUCTIONS   1) The drugs that you were given will stay in your system until tomorrow so for the next 24 hours you should not:  A) Drive an automobile B) Make any legal decisions C) Drink any alcoholic beverage   2) You may resume regular meals tomorrow.  Today it is better to start with liquids and gradually work up to solid foods.  You may eat anything you prefer, but it is better to start with liquids, then soup and crackers, and gradually work up to solid foods.   3) Please notify your doctor immediately if you have any unusual bleeding, trouble breathing, redness and pain at the surgery site, drainage, fever, or pain not relieved by medication.    4) Additional Instructions:        Please contact your physician with any problems or Same Day Surgery at [phone], Monday through Friday 6 am to 4 pm, or [facility] [place] Main number at [phone]. 

## 2019-03-01 NOTE — Progress Notes (Signed)
Pt is doing well. VSS. Up walking to bathroom x2 this afternoon. Tolerated Regular diet. Pain controlled by Tylenol and Motrin this shift- pt aware of PRN medications if needed for breakthrough pain. IS with teach back. Will continue to monitor.

## 2019-03-02 DIAGNOSIS — R102 Pelvic and perineal pain: Secondary | ICD-10-CM | POA: Diagnosis not present

## 2019-03-02 LAB — CBC
HCT: 33.6 % — ABNORMAL LOW (ref 36.0–46.0)
Hemoglobin: 11.2 g/dL — ABNORMAL LOW (ref 12.0–15.0)
MCH: 30 pg (ref 26.0–34.0)
MCHC: 33.3 g/dL (ref 30.0–36.0)
MCV: 90.1 fL (ref 80.0–100.0)
Platelets: 179 10*3/uL (ref 150–400)
RBC: 3.73 MIL/uL — ABNORMAL LOW (ref 3.87–5.11)
RDW: 12.7 % (ref 11.5–15.5)
WBC: 6.7 10*3/uL (ref 4.0–10.5)
nRBC: 0 % (ref 0.0–0.2)

## 2019-03-02 LAB — BASIC METABOLIC PANEL
Anion gap: 9 (ref 5–15)
BUN: 13 mg/dL (ref 6–20)
CO2: 23 mmol/L (ref 22–32)
Calcium: 8.7 mg/dL — ABNORMAL LOW (ref 8.9–10.3)
Chloride: 106 mmol/L (ref 98–111)
Creatinine, Ser: 0.77 mg/dL (ref 0.44–1.00)
GFR calc Af Amer: >60 mL/min (ref >60)
GFR calc non Af Amer: >60 mL/min (ref >60)
Glucose, Bld: 106 mg/dL — ABNORMAL HIGH (ref 70–99)
Potassium: 3.8 mmol/L (ref 3.5–5.1)
Sodium: 138 mmol/L (ref 135–145)

## 2019-03-02 NOTE — Discharge Summary (Signed)
GynecologicalDischarge Summary  Patient Name: Kerry Wilson DOB: 1960-05-14 MRN: [id number]  Date of Admission: 03/01/2019 Date of Discharge: 03/02/2019  Hospital course:   The patient was admitted on the day of scheduled surgery and proceeded to the operating room as scheduled for the below stated procedure without complications (For complete operative information, please see operative report).  Postoperatively she was admitted to the floor. Pain was initially managed with PO meds when the patient was tolerating a regular diet on postoperative day number 1.  Postoperative labs were stable    patient passed a voiding trial without difficulty.   Patient was felt to be stable for discharge on postoperative day number 1 when she was tolerating a regular diet, pain was controlled with po pain medications, and she was ambulating and voiding without difficulty. Vital signs were stable and physical exam remained benign throughout her hospital stay. Incision was clean,dry, and intact.  She will follow up per below for post-op check.  Rx given for Percocet, Zofran, and Colace .    She was given specific instructions and numbers to call in written and verbal format. She verbalized understanding, agrees with the plan of care, and all questions answered to her satisfaction.  Her pain is 6/10, but controlled with po meds  Discharge Physical Exam:  BP (!) 115/55 (BP Location: Right Arm)   Pulse 73   Temp 97.9 F (36.6 C) (Oral)   Resp 20   Ht 5\' 6"  (1.676 m)   Wt 104 kg   SpO2 97%   BMI 37.01 kg/m   General: NAD CV: RRR Pulm: CTABL, nl effort ABD: s/nd/nt Incision: c/d/i  DVT Evaluation: LE non-ttp, no evidence of DVT on exam.  Hemoglobin  Date Value Ref Range Status  03/02/2019 11.2 (L) 12.0 - 15.0 g/dL Final   HCT  Date Value Ref Range Status  03/02/2019 33.6 (L) 36.0 - 46.0 % Final      Plan:  Kerry Wilson was discharged to home in good condition. Follow-up appointment at  [facility] in 2 weeks   Discharge Medications: Allergies as of 03/02/2019      Reactions   Erythromycin Rash      Medication List    TAKE these medications   acetaminophen 500 MG tablet Commonly known as: Acetaminophen Extra Strength Take 2 tablets (1,000 mg total) by mouth every 6 (six) hours for 3 days.   albuterol 108 (90 Base) MCG/ACT inhaler Commonly known as: VENTOLIN HFA Inhale 1-2 puffs into the lungs every 6 (six) hours as needed for wheezing or shortness of breath.   docusate sodium 100 MG capsule Commonly known as: COLACE Take 1 capsule (100 mg total) by mouth 2 (two) times daily. To keep stools soft   gabapentin 800 MG tablet Commonly known as: Neurontin Take 1 tablet (800 mg total) by mouth at bedtime for 14 days. Take nightly for 3 days, then up to 14 days as needed   ibuprofen 800 MG tablet Commonly known as: ADVIL Take 1 tablet (800 mg total) by mouth every 8 (eight) hours for 3 days. After 72hrs, may take PRN as needed What changed:   medication strength  how much to take  when to take this  reasons to take this  additional instructions   IRON PO Take 1 tablet by mouth daily.   oxycodone 5 MG capsule Commonly known as: OXY-IR Take 1 capsule (5 mg total) by mouth every 6 (six) hours as needed for pain.  Follow-up Information    Benjaman Kindler, MD. Go on 03/15/2019.   Specialty: Obstetrics and Gynecology Why: at 2:30pm for postop check Contact information: [place] [address] [phone]           Signed: Benjaman Kindler, MD 1:59 PM

## 2019-03-02 NOTE — Progress Notes (Signed)
Discharge instructions, prescriptions, education, and appointments given and explained. Pt verbalized understanding with no further questions. Pt wheeled to personal vehicle per staff with belongings.

## 2019-03-03 LAB — SURGICAL PATHOLOGY

## 2021-07-23 ENCOUNTER — Other Ambulatory Visit: Payer: Self-pay | Admitting: Family Medicine

## 2021-07-23 DIAGNOSIS — Z1231 Encounter for screening mammogram for malignant neoplasm of breast: Secondary | ICD-10-CM

## 2021-09-04 ENCOUNTER — Ambulatory Visit
Admission: RE | Admit: 2021-09-04 | Discharge: 2021-09-04 | Disposition: A | Discharge status: Home / Self Care | Payer: BC Managed Care – PPO | Source: Ambulatory Visit | Attending: Family Medicine | Admitting: Family Medicine

## 2021-09-04 ENCOUNTER — Encounter (INDEPENDENT_AMBULATORY_CARE_PROVIDER_SITE_OTHER): Payer: Self-pay

## 2021-09-04 DIAGNOSIS — Z1231 Encounter for screening mammogram for malignant neoplasm of breast: Secondary | ICD-10-CM | POA: Diagnosis present

## 2023-02-13 ENCOUNTER — Other Ambulatory Visit: Payer: Self-pay

## 2024-04-06 IMAGING — MG MM DIGITAL SCREENING BILAT W/ TOMO AND CAD
8 series · 8 of 24 positions shown · non-contrast
Comparison: Previous exam(s).

CLINICAL DATA: Screening.

EXAM:
DIGITAL SCREENING BILATERAL MAMMOGRAM WITH TOMOSYNTHESIS AND CAD
TECHNIQUE: Bilateral screening digital craniocaudal and mediolateral oblique
mammograms were obtained. Bilateral screening digital breast
tomosynthesis was performed. The images were evaluated with
computer-aided detection.

[R CC synth-2D]
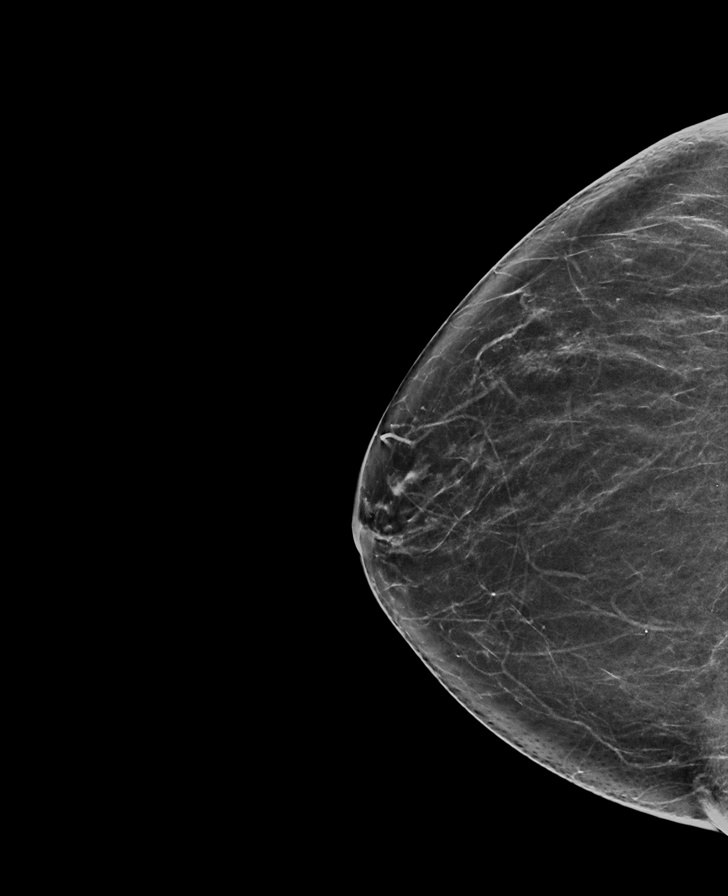

[L MLO synth-2D]
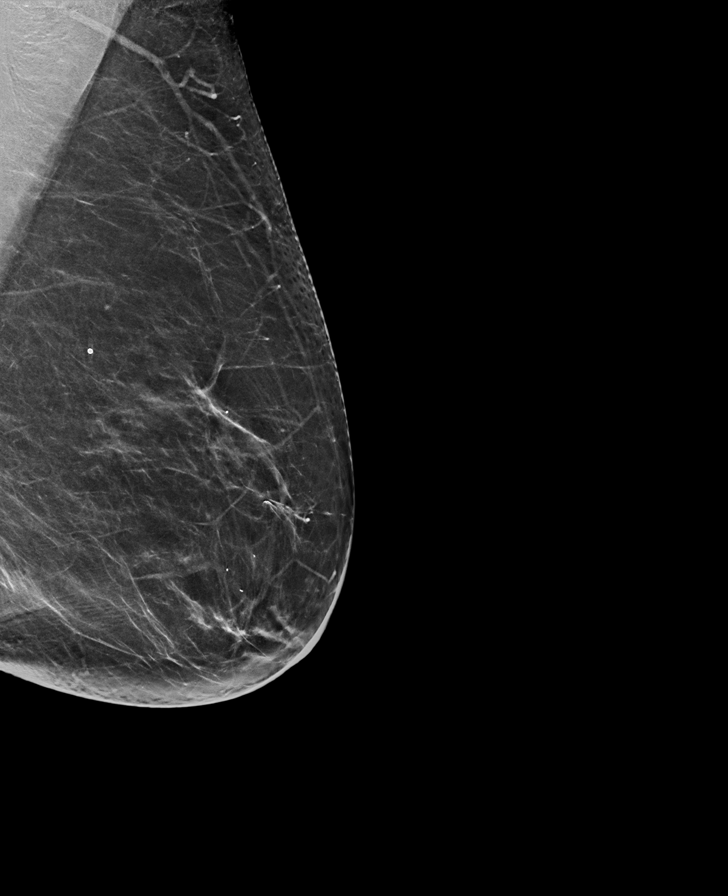

[L CC synth-2D]
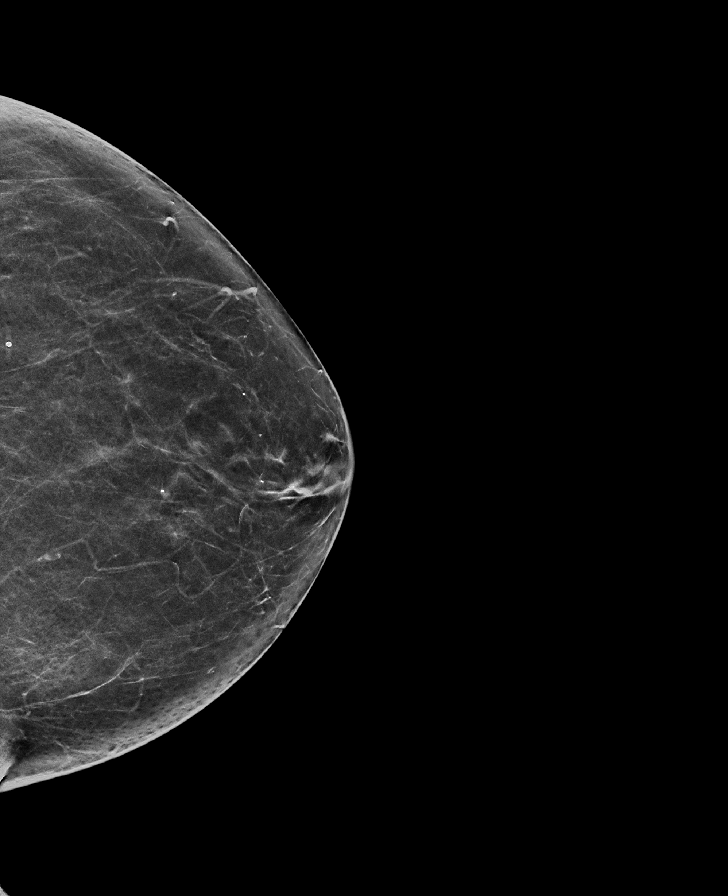

[R MLO synth-2D]
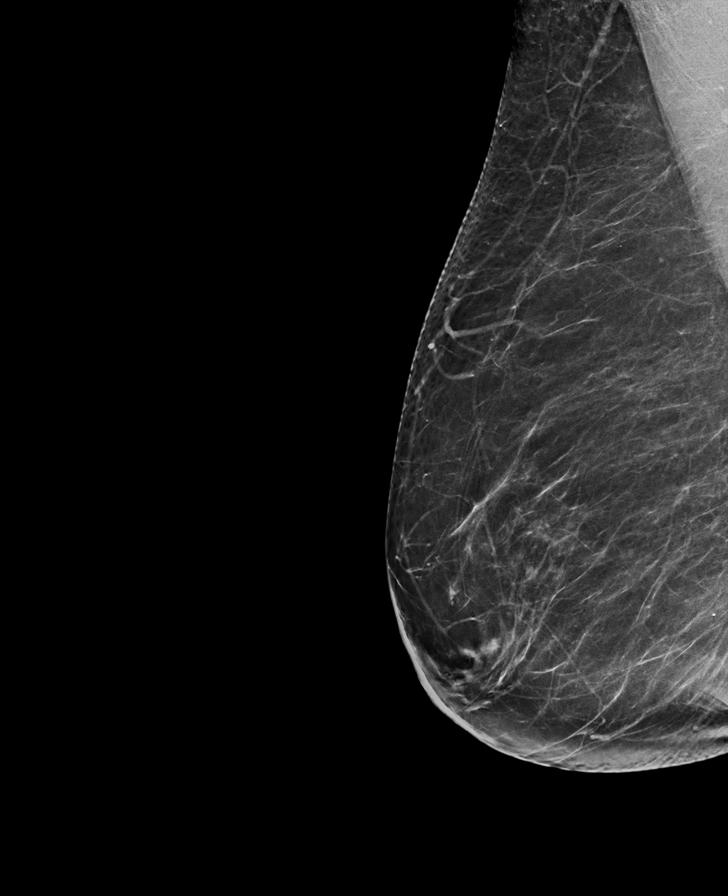

[R CC tomo · tomo slice 35/68.0]
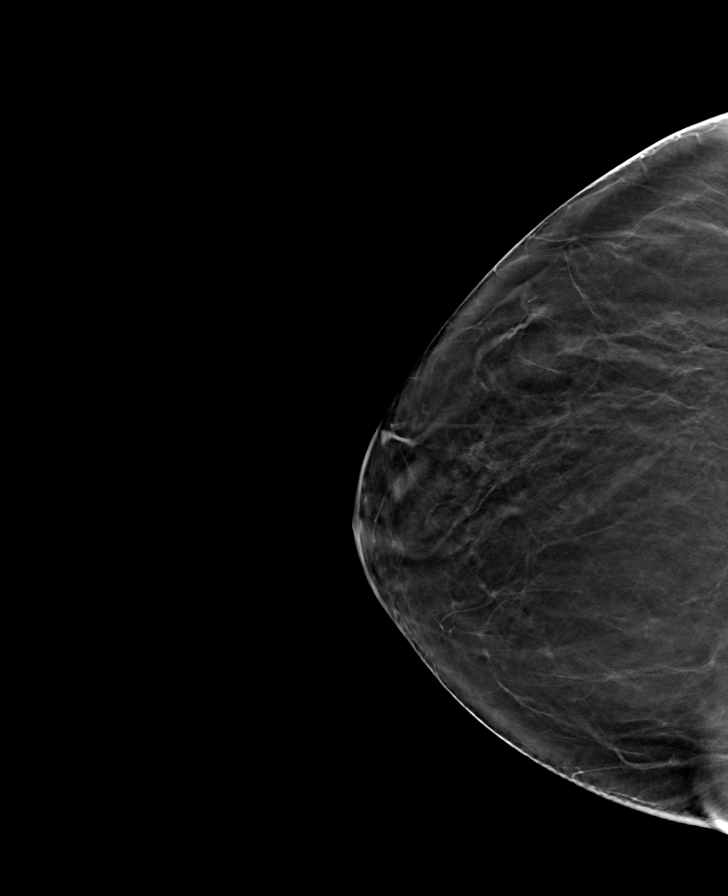

[L MLO tomo · tomo slice 38/75.0]
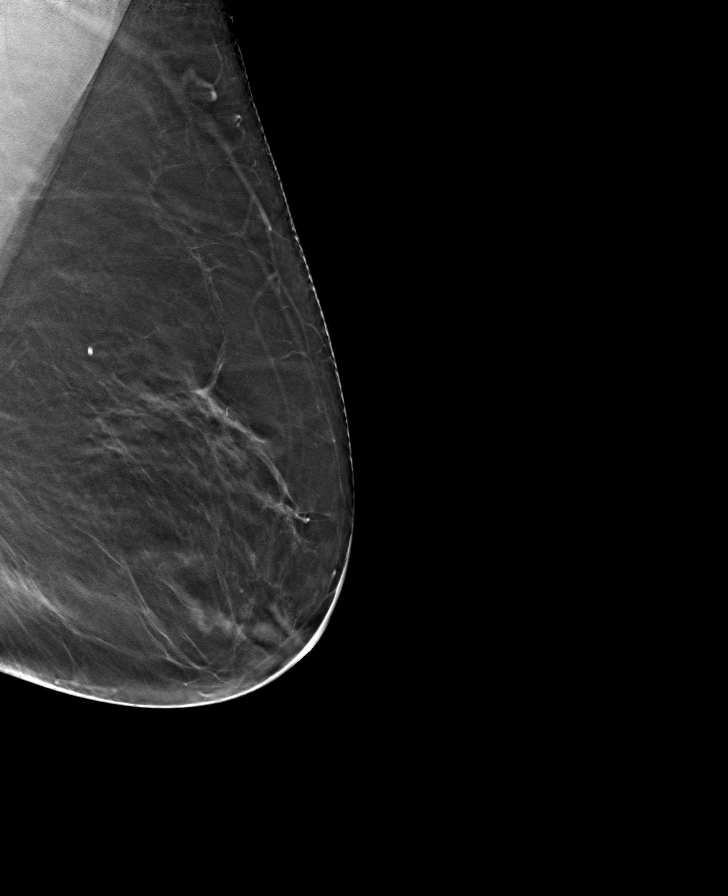

[R MLO tomo · tomo slice 38/75.0]
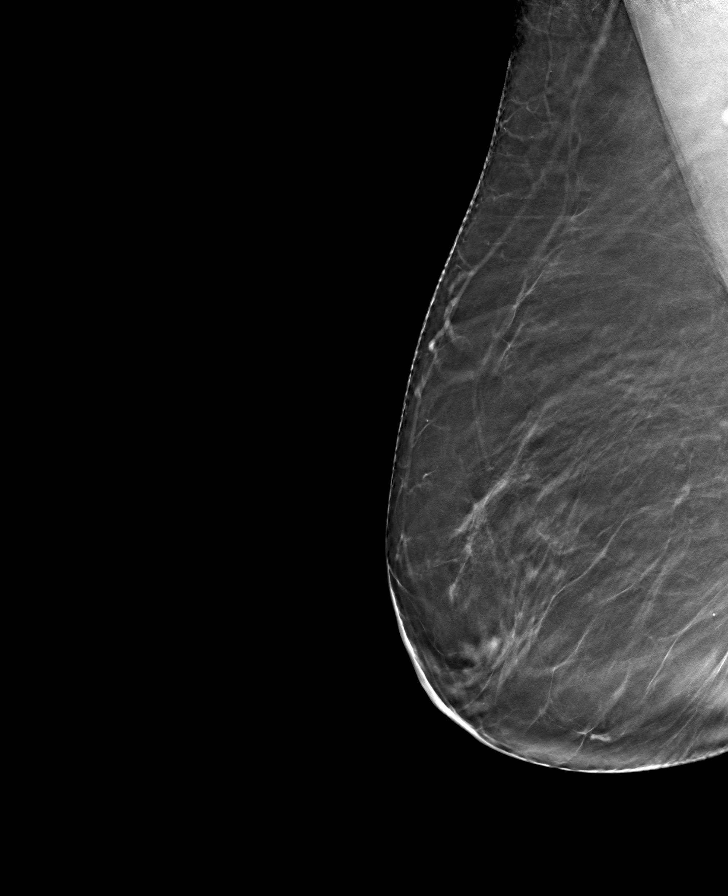

[L CC tomo · tomo slice 33/65.0]
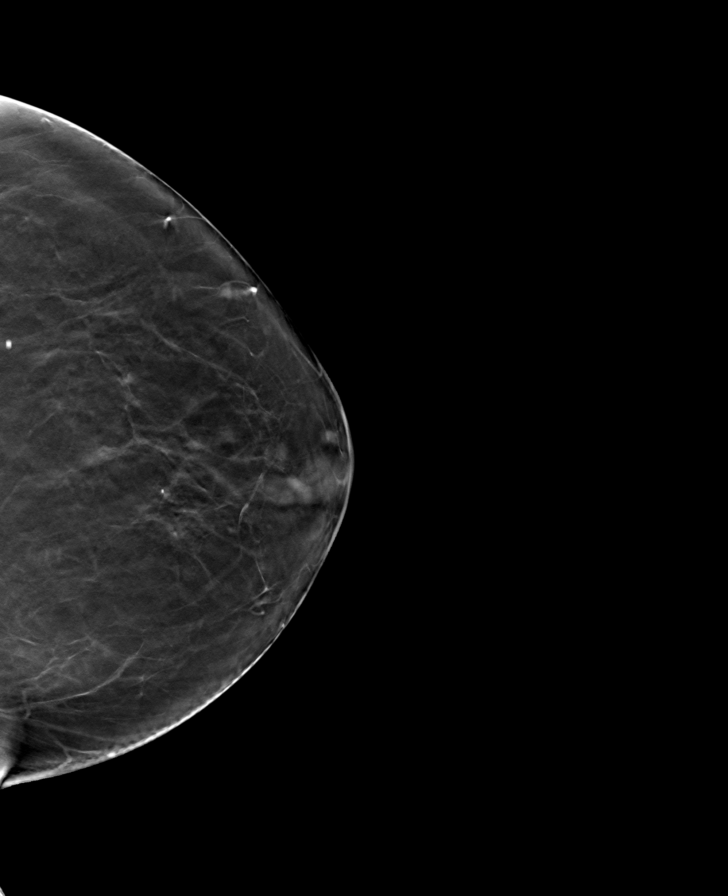

[8 of 24 positions shown; findings below may reference images not displayed]

ACR Breast Density Category b: There are scattered areas of
fibroglandular density.
FINDINGS: There are no findings suspicious for malignancy.
IMPRESSION: No mammographic evidence of malignancy. A result letter of this
screening mammogram will be mailed directly to the patient.

RECOMMENDATION:
Screening mammogram in one year. (Code:51-O-LD2)

BI-RADS CATEGORY  1: Negative.
